# Patient Record
Sex: Male | Born: 1955 | Race: Black or African American | Hispanic: No | Marital: Married | Smoking: Current every day smoker
Health system: Southern US, Community
[De-identification: ages and names within clinical notes are randomized; demographics above are authoritative.]

## PROBLEM LIST (undated history)

## (undated) DIAGNOSIS — I10 Essential (primary) hypertension: Secondary | ICD-10-CM

---

## 2019-12-01 ENCOUNTER — Emergency Department (HOSPITAL_COMMUNITY)
Admission: EM | Admit: 2019-12-01 | Discharge: 2019-12-01 | Disposition: A | Discharge status: Home / Self Care | Payer: Federal, State, Local not specified - PPO | Attending: Emergency Medicine | Admitting: Emergency Medicine

## 2019-12-01 ENCOUNTER — Emergency Department (HOSPITAL_COMMUNITY): Payer: Federal, State, Local not specified - PPO

## 2019-12-01 ENCOUNTER — Encounter (HOSPITAL_COMMUNITY): Payer: Self-pay | Admitting: Emergency Medicine

## 2019-12-01 ENCOUNTER — Other Ambulatory Visit: Payer: Self-pay

## 2019-12-01 DIAGNOSIS — R1031 Right lower quadrant pain: Secondary | ICD-10-CM | POA: Diagnosis not present

## 2019-12-01 DIAGNOSIS — F17200 Nicotine dependence, unspecified, uncomplicated: Secondary | ICD-10-CM | POA: Insufficient documentation

## 2019-12-01 DIAGNOSIS — R110 Nausea: Secondary | ICD-10-CM

## 2019-12-01 DIAGNOSIS — R197 Diarrhea, unspecified: Secondary | ICD-10-CM | POA: Diagnosis not present

## 2019-12-01 DIAGNOSIS — Z79899 Other long term (current) drug therapy: Secondary | ICD-10-CM | POA: Diagnosis not present

## 2019-12-01 DIAGNOSIS — R630 Anorexia: Secondary | ICD-10-CM | POA: Insufficient documentation

## 2019-12-01 DIAGNOSIS — I10 Essential (primary) hypertension: Secondary | ICD-10-CM | POA: Insufficient documentation

## 2019-12-01 DIAGNOSIS — R112 Nausea with vomiting, unspecified: Secondary | ICD-10-CM | POA: Insufficient documentation

## 2019-12-01 HISTORY — DX: Essential (primary) hypertension: I10

## 2019-12-01 LAB — URINALYSIS, ROUTINE W REFLEX MICROSCOPIC
Bilirubin Urine: NEGATIVE
Glucose, UA: NEGATIVE mg/dL
Hgb urine dipstick: NEGATIVE
Ketones, ur: NEGATIVE mg/dL
Leukocytes,Ua: NEGATIVE
Nitrite: NEGATIVE
Protein, ur: NEGATIVE mg/dL
Specific Gravity, Urine: 1.028 (ref 1.005–1.030)
pH: 5 (ref 5.0–8.0)

## 2019-12-01 LAB — COMPREHENSIVE METABOLIC PANEL
ALT: 71 U/L — ABNORMAL HIGH (ref 0–44)
AST: 50 U/L — ABNORMAL HIGH (ref 15–41)
Albumin: 3.7 g/dL (ref 3.5–5.0)
Alkaline Phosphatase: 65 U/L (ref 38–126)
Anion gap: 11 (ref 5–15)
BUN: 31 mg/dL — ABNORMAL HIGH (ref 8–23)
CO2: 25 mmol/L (ref 22–32)
Calcium: 9.8 mg/dL (ref 8.9–10.3)
Chloride: 105 mmol/L (ref 98–111)
Creatinine, Ser: 1.72 mg/dL — ABNORMAL HIGH (ref 0.61–1.24)
GFR calc Af Amer: 48 mL/min — ABNORMAL LOW (ref >60)
GFR calc non Af Amer: 42 mL/min — ABNORMAL LOW (ref >60)
Glucose, Bld: 149 mg/dL — ABNORMAL HIGH (ref 70–99)
Potassium: 4 mmol/L (ref 3.5–5.1)
Sodium: 141 mmol/L (ref 135–145)
Total Bilirubin: 0.8 mg/dL (ref 0.3–1.2)
Total Protein: 7.1 g/dL (ref 6.5–8.1)

## 2019-12-01 LAB — CBC
HCT: 43.5 % (ref 39.0–52.0)
Hemoglobin: 14.4 g/dL (ref 13.0–17.0)
MCH: 30 pg (ref 26.0–34.0)
MCHC: 33.1 g/dL (ref 30.0–36.0)
MCV: 90.6 fL (ref 80.0–100.0)
Platelets: 283 10*3/uL (ref 150–400)
RBC: 4.8 MIL/uL (ref 4.22–5.81)
RDW: 12.3 % (ref 11.5–15.5)
WBC: 8.1 10*3/uL (ref 4.0–10.5)
nRBC: 0 % (ref 0.0–0.2)

## 2019-12-01 LAB — LIPASE, BLOOD: Lipase: 42 U/L (ref 11–51)

## 2019-12-01 MED ORDER — SODIUM CHLORIDE 0.9% FLUSH: 3.0000 mL | Freq: Once | INTRAVENOUS | Status: DC

## 2019-12-01 MED ORDER — IOHEXOL 300 MG/ML  SOLN
100.0000 mL | Freq: Once | INTRAMUSCULAR | Status: AC | PRN
  Administered 2019-12-01: 100 mL via INTRAVENOUS

## 2019-12-01 MED ORDER — LACTATED RINGERS IV BOLUS
1000.0000 mL | Freq: Once | INTRAVENOUS | Status: AC
  Administered 2019-12-01: 1000 mL via INTRAVENOUS

## 2019-12-01 NOTE — ED Notes (Addendum)
Discharge instructions discussed with pt. Pt verbalized understanding with no questions at this time.  

## 2019-12-01 NOTE — ED Provider Notes (Signed)
[facility] EMERGENCY DEPARTMENT Provider Note   CSN: [id number] Arrival date & time: 12/01/19  1953     History Chief Complaint  Patient presents with  . Abdominal Pain    Richard Bates is a 64 y.o. male.  HPI 64 year old male with history of hypertension presenting to the ED for right lower quadrant abdominal pain.  Patient states that for 2 days he has had some anorexia, nausea as well as 2 episodes of nonbloody diarrhea.  Patient states that the pain began diffusely, is located in the right lower quadrant now, states that he is having persistent cramping type pain.  No vomiting, does report some nausea and dry heaving.  No fevers or chills, denies any chest pain or shortness of breath.  No flank pain, no hematuria, no dysuria, no groin pain, no testicular pain.      Past Medical History:  Diagnosis Date  . Hypertension     There are no problems to display for this patient.   History reviewed. No pertinent surgical history.     No family history on file.  Social History   Tobacco Use  . Smoking status: Current Every Day Smoker  . Smokeless tobacco: Never Used  Substance Use Topics  . Alcohol use: Yes  . Drug use: Never    Home Medications Prior to Admission medications   Medication Sig Start Date End Date Taking? Authorizing Provider  atorvastatin (LIPITOR) 40 MG tablet Take 40 mg by mouth daily. 09/22/19  Yes [provider]  hydrochlorothiazide (MICROZIDE) 12.5 MG capsule Take 12.5 mg by mouth daily. 09/23/19  Yes [provider]  metoprolol tartrate (LOPRESSOR) 50 MG tablet Take 50 mg by mouth daily. 09/22/19  Yes [provider]  Multiple Vitamin (MULTIVITAMIN WITH MINERALS) TABS tablet Take 1 tablet by mouth daily.   Yes [provider]  olmesartan (BENICAR) 40 MG tablet Take 40 mg by mouth daily. 09/22/19  Yes [provider]    Allergies    Patient has no known allergies.  Review of  Systems   Review of Systems  Constitutional: Negative for chills and fever.  HENT: Negative for ear pain and sore throat.   Eyes: Negative for pain and visual disturbance.  Respiratory: Negative for cough and shortness of breath.   Cardiovascular: Negative for chest pain and palpitations.  Gastrointestinal: Positive for abdominal pain and diarrhea. Negative for vomiting.  Genitourinary: Negative for dysuria and hematuria.  Musculoskeletal: Negative for arthralgias and back pain.  Skin: Negative for color change and rash.  Neurological: Negative for seizures and syncope.  All other systems reviewed and are negative.   Physical Exam Updated Vital Signs BP (!) 145/94   Pulse 85   Temp 98.3 F (36.8 C) (Oral)   Resp (!) 22   SpO2 96%   Physical Exam Vitals and nursing note reviewed.  Constitutional:      General: He is not in acute distress.    Appearance: He is well-developed. He is not ill-appearing, toxic-appearing or diaphoretic.  HENT:     Head: Normocephalic and atraumatic.  Eyes:     Conjunctiva/sclera: Conjunctivae normal.  Cardiovascular:     Rate and Rhythm: Normal rate and regular rhythm.     Heart sounds: No murmur.  Pulmonary:     Effort: Pulmonary effort is normal. No respiratory distress.     Breath sounds: Normal breath sounds.  Abdominal:     Palpations: Abdomen is soft.     Tenderness: There  is abdominal tenderness in the right lower quadrant. There is no right CVA tenderness, left CVA tenderness, guarding or rebound. Negative signs include Murphy's sign, Rovsing's sign, McBurney's sign, psoas sign and obturator sign.     Hernia: No hernia is present.  Musculoskeletal:     Cervical back: Neck supple.  Skin:    General: Skin is warm and dry.     Capillary Refill: Capillary refill takes less than 2 seconds.  Neurological:     General: No focal deficit present.     Mental Status: He is alert.     ED Results / Procedures / Treatments   Labs (all labs  ordered are listed, but only abnormal results are displayed) Labs Reviewed  COMPREHENSIVE METABOLIC PANEL - Abnormal; Notable for the following components:      Result Value   Glucose, Bld 149 (*)    BUN 31 (*)    Creatinine, Ser 1.72 (*)    AST 50 (*)    ALT 71 (*)    GFR calc non Af Amer 42 (*)    GFR calc Af Amer 48 (*)    All other components within normal limits  LIPASE, BLOOD  CBC  URINALYSIS, ROUTINE W REFLEX MICROSCOPIC    EKG None  Radiology CT ABDOMEN PELVIS W CONTRAST  Result Date: 12/01/2019 CLINICAL DATA:  Generalized abdominal pain, acute, with neutropenia EXAM: CT ABDOMEN AND PELVIS WITH CONTRAST TECHNIQUE: Multidetector CT imaging of the abdomen and pelvis was performed using the standard protocol following bolus administration of intravenous contrast. CONTRAST:  OMNIPAQUE IOHEXOL 300 MG/ML  SOLN COMPARISON:  None. FINDINGS: Lower chest: Lung bases are clear. Normal heart size. No pericardial effusion. Hepatobiliary: No focal liver abnormality is seen. No gallstones, gallbladder wall thickening, or biliary dilatation. Pancreas: Unremarkable. No pancreatic ductal dilatation or surrounding inflammatory changes. Spleen: Normal in size without focal abnormality. Adrenals/Urinary Tract: Normal adrenal glands. Mild bilateral symmetric perinephric stranding, a nonspecific finding. No visible or contour deforming renal lesions. No obstructive urolithiasis or hydronephrosis. Some mild circumferential bladder wall thickening. Indentation of the bladder base by a borderline enlarged prostate with median lobe hypertrophy. Stomach/Bowel: Distal esophagus, stomach and duodenal sweep are unremarkable. No small bowel wall thickening or dilatation. No evidence of obstruction. A normal appendix is visualized. No colonic dilatation or wall thickening. Scattered colonic diverticula without focal pericolonic inflammation to suggest diverticulitis. Vascular/Lymphatic: Minimal atherosclerotic  plaque in the aorta and branch vessels. No aneurysm or ectasia. No concerning abdominopelvic adenopathy. Reproductive: Borderline prostatomegaly with median lobe hypertrophy and indentation of the posterior bladder base, as detailed above. Seminal vesicles are unremarkable. Other: No abdominopelvic free fluid or free gas. No bowel containing hernias. Small fat containing inguinal hernias, left greater than right. Musculoskeletal: Multilevel degenerative changes are present in the imaged portions of the spine. Features most pronounced at L4-5 and L5-S1. No acute osseous abnormality or suspicious osseous lesion. IMPRESSION: 1. Question some mild bladder wall thickening with faint perivesicular haze and nonspecific bilateral perinephric stranding. Should correlate with urinalysis to exclude a urinary tract infection. 2. No other acute abdominopelvic abnormality to provide cause for patient's symptoms. 3. Colonic diverticulosis without focal pericolonic inflammation to suggest diverticulitis. 4. Borderline prostatomegaly with median lobe hypertrophy and indentation of the posterior bladder base. Electronically Signed   By: Kreg Shropshire M.D.   On: 12/01/2019 23:04    Procedures Procedures (including critical care time)  Medications Ordered in ED Medications  sodium chloride flush (NS) 0.9 % injection 3 mL (3  mLs Intravenous Not Given 12/01/19 2211)  lactated ringers bolus 1,000 mL (0 mLs Intravenous Stopped 12/01/19 2330)  iohexol (OMNIPAQUE) 300 MG/ML solution 100 mL (100 mLs Intravenous Contrast Given 12/01/19 2243)    ED Course  I have reviewed the triage vital signs and the nursing notes.  Pertinent labs & imaging results that were available during my care of the patient were reviewed by me and considered in my medical decision making (see chart for details).    MDM Rules/Calculators/A&P                      64yo male presenting to the ED for right sided abdominal pain.  On arrival hemodynamically  stable, afebrile, well-appearing.  Patient was mildly tender in the right lower quadrant but no peritoneal signs present, nonsurgical abdomen on my exam.  Patient otherwise appears well, no other symptoms currently concerning for infectious etiology.  Urine appears noninfected, CBC reassuring, metabolic panel significant for kidney function that is elevated, creatinine elevated as well.  Unsure etiology, likely secondary to volume depletion secondary to his diarrhea.  Patient is on an ARB, could be contributing.  CT scan was obtained which was negative for any acute abnormalities, there was some mild right-sided perinephric stranding noted however patient has no signs of abscess, urine noninfected, doubt pyelonephritis, could be element of urinary retention given patient does have enlarged prostate on CT scan..  Patient's exam continued to be benign, patient was given fluid resuscitation in the ED.  Patient instructed to follow-up with his PCP regarding his kidney function, as well as possible medication cessation.  Return precautions were given, he agreed and understood plan, discharged home in good condition.    The attending physician was present and available for all medical decision making and procedures related to this patient's care.  Final Clinical Impression(s) / ED Diagnoses Final diagnoses:  Right lower quadrant abdominal pain  Nausea    Rx / DC Orders ED Discharge Orders    None       Kizzie Fantasia, MD 12/01/19 4081    Blanchie Dessert, MD 12/02/19 1344

## 2019-12-01 NOTE — ED Triage Notes (Signed)
PT c/o RLQ pain that started this morning. Reports nausea/vomiting/diarrhea on Saturday. Denies urinary symptoms.

## 2019-12-01 NOTE — Discharge Instructions (Signed)
Please follow up with your PCP if continued pain. Tylenol and motrin for pain as needed. Your CT scan and labs are reassuring, but your kidney function is up. Please see your PCP for repeat BMP in the next week. Continue to hydrate yourself as much as possible.

## 2019-12-01 NOTE — ED Notes (Signed)
Patient transported to CT 

## 2020-10-10 IMAGING — CT CT ABD-PELV W/ CM
2 of 5 series · 16 of 46 positions shown, 18 images · IV contrast (APPLIED)
Comparison: None.

CLINICAL DATA: Generalized abdominal pain, acute, with neutropenia

EXAM:
CT ABDOMEN AND PELVIS WITH CONTRAST
TECHNIQUE: Multidetector CT imaging of the abdomen and pelvis was performed
using the standard protocol following bolus administration of
intravenous contrast.
CONTRAST:  100mL OMNIPAQUE IOHEXOL 300 MG/ML  SOLN

[Series 3: abd/ pelvis 5.0 i30f 2 · axial · 0.98mm/px · z∈[+705,+1145]mm · 13 of 100 slices shown, 15 images]
[im 6/100  soft-tissue]
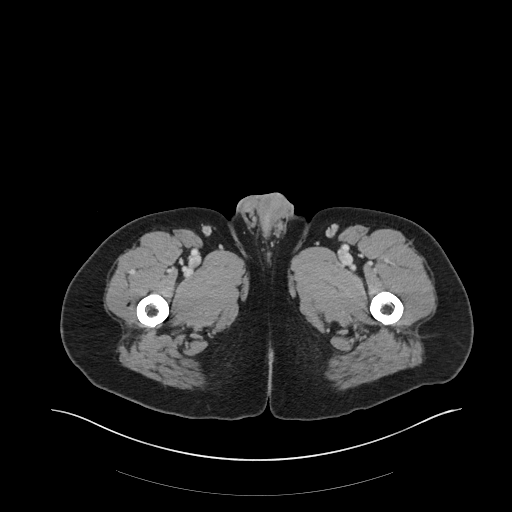
[im 6/100  bone]
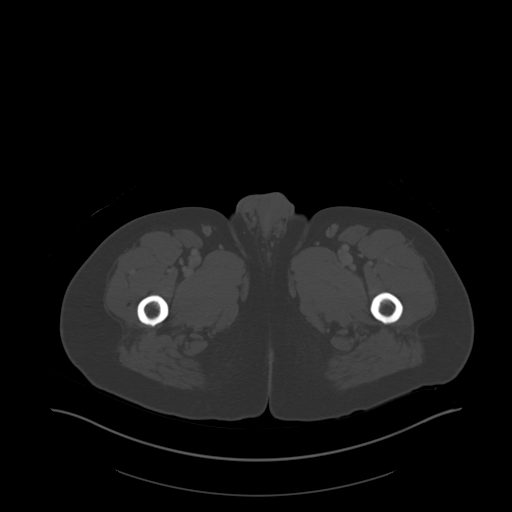
[im 16/100  soft-tissue]
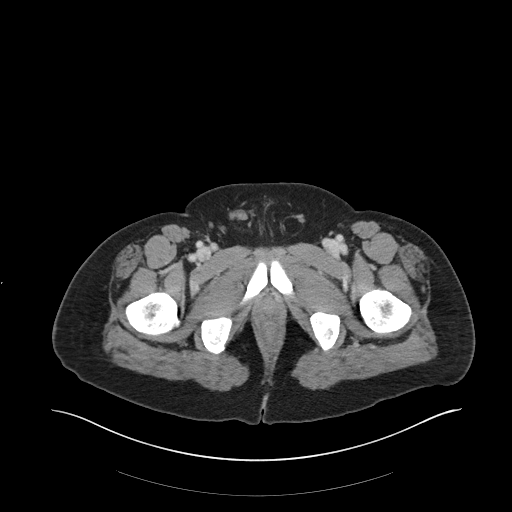
[im 21/100  soft-tissue]
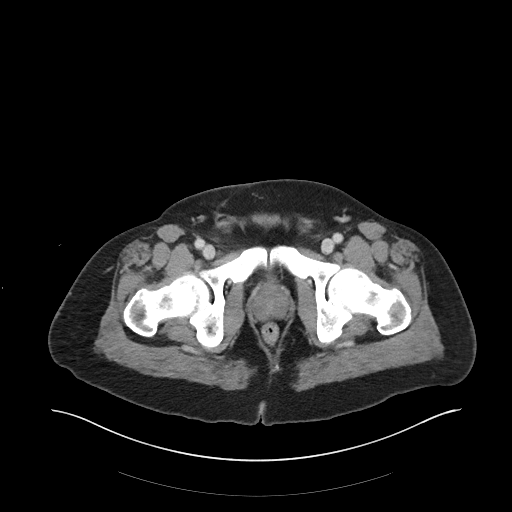
[im 27/100  soft-tissue]
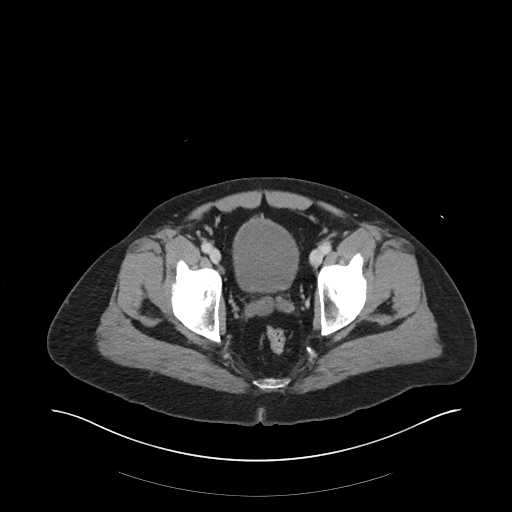
[im 37/100  soft-tissue]
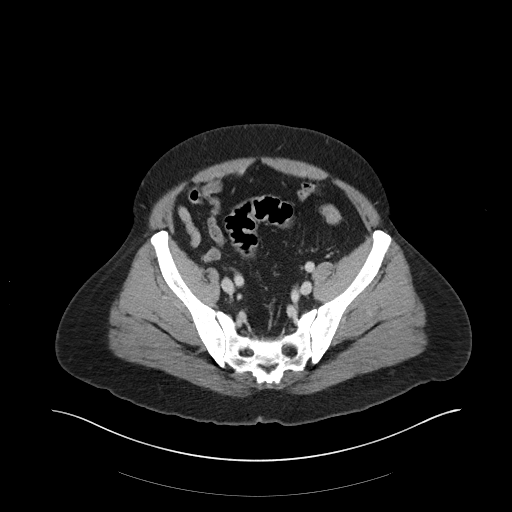
[im 42/100  soft-tissue]
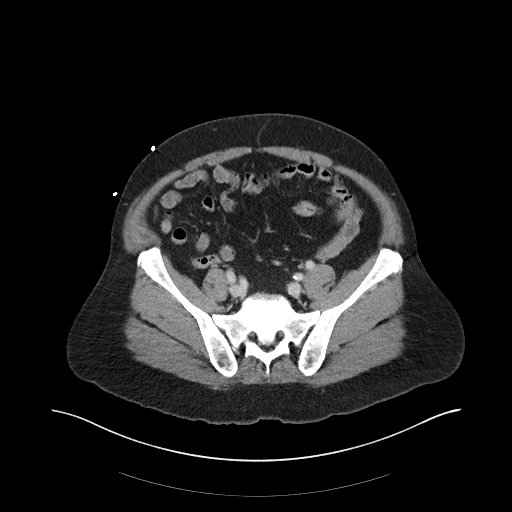
[im 53/100  soft-tissue]
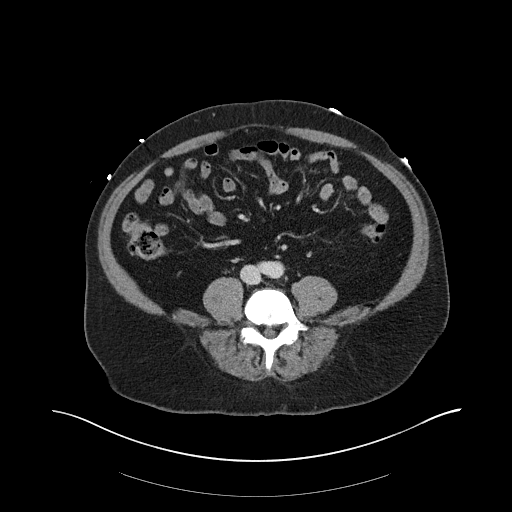
[im 58/100  soft-tissue]
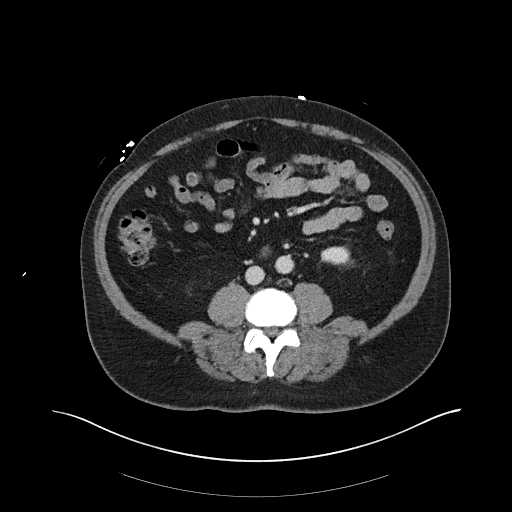
[im 63/100  soft-tissue]
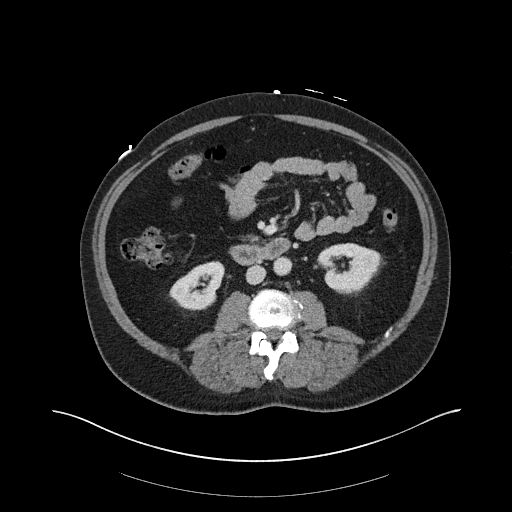
[im 63/100  bone]
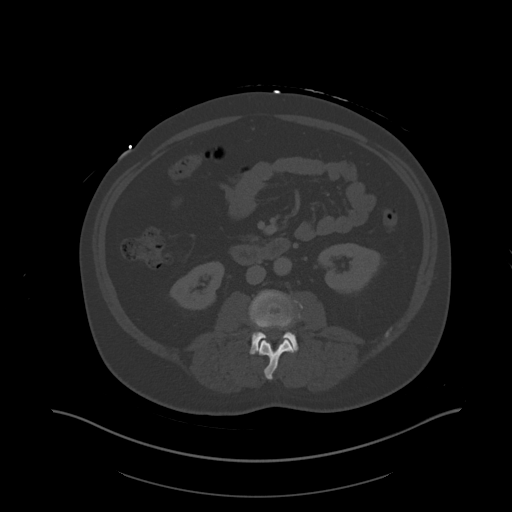
[im 73/100  soft-tissue]
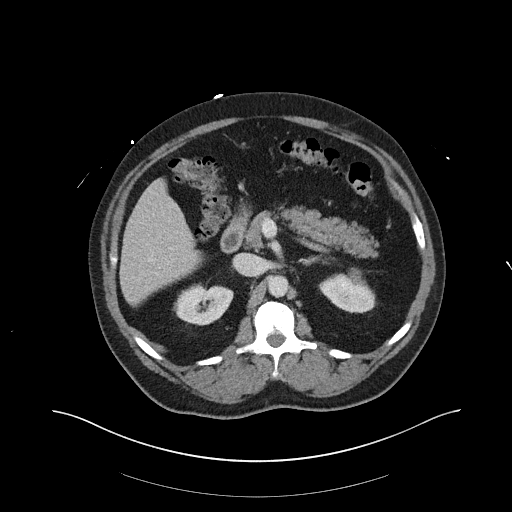
[im 79/100  soft-tissue]
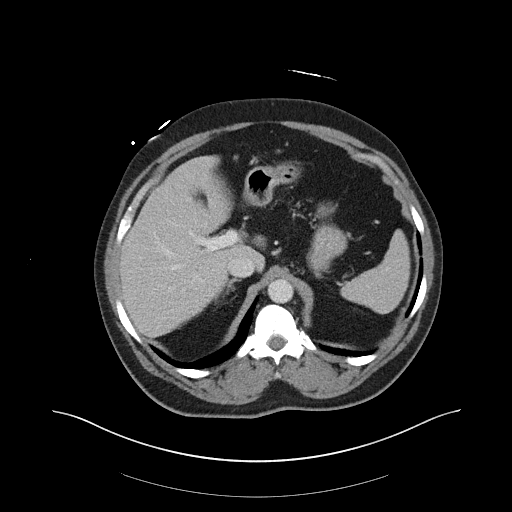
[im 84/100  soft-tissue]
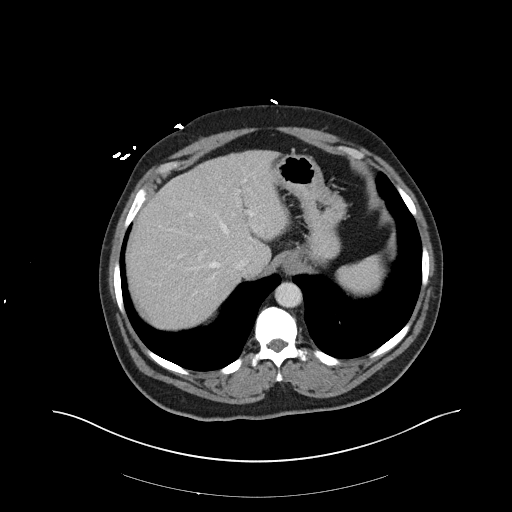
[im 94/100  soft-tissue]
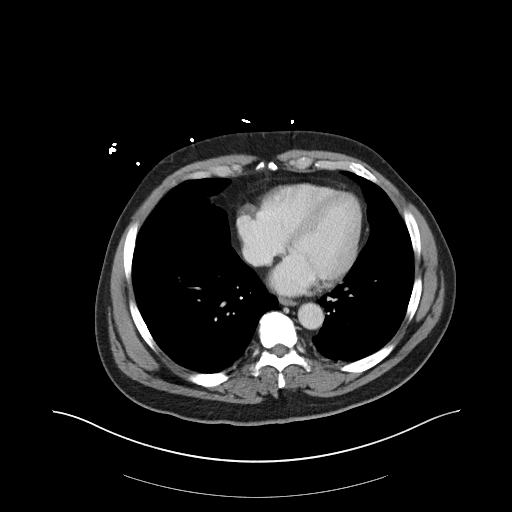

[Series 7: coronal soft tissue · coronal · 0.86mm/px · 3 of 124 slices shown]
[im 42/124  soft-tissue]
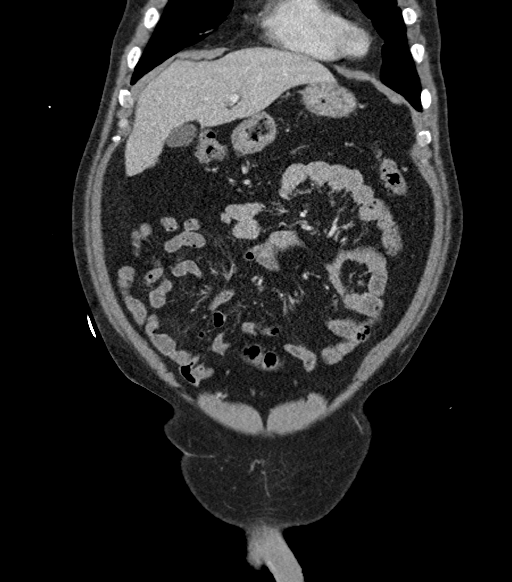
[im 55/124  soft-tissue]
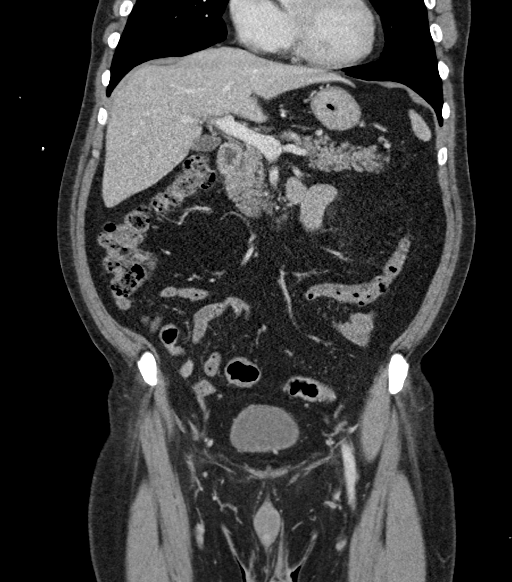
[im 69/124  soft-tissue]
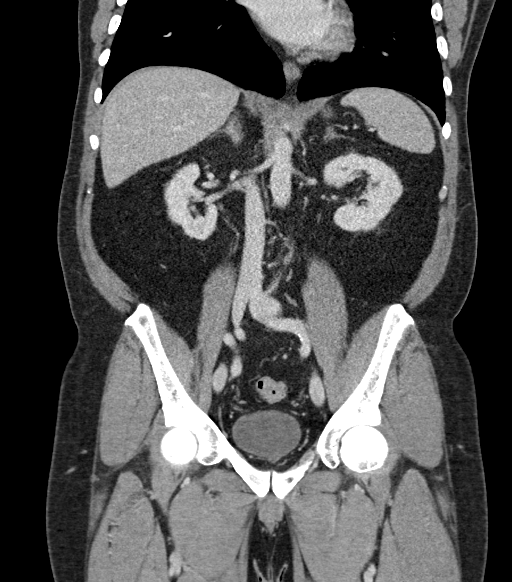

[16 of 46 positions shown; findings below may reference images not displayed]

FINDINGS: Lower chest: Lung bases are clear. Normal heart size. No pericardial
effusion.

Hepatobiliary: No focal liver abnormality is seen. No gallstones,
gallbladder wall thickening, or biliary dilatation.

Pancreas: Unremarkable. No pancreatic ductal dilatation or
surrounding inflammatory changes.

Spleen: Normal in size without focal abnormality.

Adrenals/Urinary Tract: Normal adrenal glands. Mild bilateral
symmetric perinephric stranding, a nonspecific finding. No visible
or contour deforming renal lesions. No obstructive urolithiasis or
hydronephrosis. Some mild circumferential bladder wall thickening.
Indentation of the bladder base by a borderline enlarged prostate
with median lobe hypertrophy.

Stomach/Bowel: Distal esophagus, stomach and duodenal sweep are
unremarkable. No small bowel wall thickening or dilatation. No
evidence of obstruction. A normal appendix is visualized. No colonic
dilatation or wall thickening. Scattered colonic diverticula without
focal pericolonic inflammation to suggest diverticulitis.

Vascular/Lymphatic: Minimal atherosclerotic plaque in the aorta and
branch vessels. No aneurysm or ectasia. No concerning abdominopelvic
adenopathy.

Reproductive: Borderline prostatomegaly with median lobe hypertrophy
and indentation of the posterior bladder base, as detailed above.
Seminal vesicles are unremarkable.

Other: No abdominopelvic free fluid or free gas. No bowel containing
hernias. Small fat containing inguinal hernias, left greater than
right.

Musculoskeletal: Multilevel degenerative changes are present in the
imaged portions of the spine. Features most pronounced at L4-5 and
L5-S1. No acute osseous abnormality or suspicious osseous lesion.
IMPRESSION: 1. Question some mild bladder wall thickening with faint
perivesicular haze and nonspecific bilateral perinephric stranding.
Should correlate with urinalysis to exclude a urinary tract
infection.
2. No other acute abdominopelvic abnormality to provide cause for
patient's symptoms.
3. Colonic diverticulosis without focal pericolonic inflammation to
suggest diverticulitis.
4. Borderline prostatomegaly with median lobe hypertrophy and
indentation of the posterior bladder base.

## 2020-11-11 ENCOUNTER — Ambulatory Visit (INDEPENDENT_AMBULATORY_CARE_PROVIDER_SITE_OTHER): Payer: Federal, State, Local not specified - PPO

## 2020-11-11 ENCOUNTER — Ambulatory Visit (HOSPITAL_COMMUNITY)
Admission: EM | Admit: 2020-11-11 | Discharge: 2020-11-11 | Disposition: A | Discharge status: Home / Self Care | Payer: Federal, State, Local not specified - PPO | Attending: Student | Admitting: Student

## 2020-11-11 ENCOUNTER — Encounter (HOSPITAL_COMMUNITY): Payer: Self-pay

## 2020-11-11 ENCOUNTER — Other Ambulatory Visit: Payer: Self-pay

## 2020-11-11 DIAGNOSIS — Z23 Encounter for immunization: Secondary | ICD-10-CM

## 2020-11-11 DIAGNOSIS — S99921A Unspecified injury of right foot, initial encounter: Secondary | ICD-10-CM | POA: Diagnosis not present

## 2020-11-11 DIAGNOSIS — W2209XA Striking against other stationary object, initial encounter: Secondary | ICD-10-CM | POA: Diagnosis not present

## 2020-11-11 MED ORDER — TETANUS-DIPHTH-ACELL PERTUSSIS 5-2.5-18.5 LF-MCG/0.5 IM SUSY
PREFILLED_SYRINGE | INTRAMUSCULAR | Status: AC
  Filled 2020-11-11: qty 0.5

## 2020-11-11 MED ORDER — DOXYCYCLINE HYCLATE 100 MG PO CAPS: 100.0000 mg | ORAL_CAPSULE | Freq: Two times a day (BID) | ORAL | 0 refills | Status: AC

## 2020-11-11 MED ORDER — NAPROXEN 500 MG PO TABS: 500.0000 mg | ORAL_TABLET | Freq: Two times a day (BID) | ORAL | 0 refills | Status: DC

## 2020-11-11 MED ORDER — TETANUS-DIPHTH-ACELL PERTUSSIS 5-2.5-18.5 LF-MCG/0.5 IM SUSY
0.5000 mL | PREFILLED_SYRINGE | Freq: Once | INTRAMUSCULAR | Status: AC
  Administered 2020-11-11: 0.5 mL via INTRAMUSCULAR

## 2020-11-11 MED ORDER — IBUPROFEN 800 MG PO TABS
800.0000 mg | ORAL_TABLET | Freq: Once | ORAL | Status: AC
  Administered 2020-11-11: 800 mg via ORAL

## 2020-11-11 MED ORDER — TRIPLE ANTIBIOTIC 5-400-5000 EX OINT: TOPICAL_OINTMENT | Freq: Two times a day (BID) | CUTANEOUS | 0 refills | Status: AC

## 2020-11-11 MED ORDER — IBUPROFEN 800 MG PO TABS
ORAL_TABLET | ORAL | Status: AC
  Filled 2020-11-11: qty 1

## 2020-11-11 NOTE — ED Provider Notes (Signed)
[facility]    CSN: [id number] Arrival date & time: 11/11/20  1052      History   Chief Complaint Chief Complaint  Patient presents with  . Foot Injury    HPI Richard Bates is a 65 y.o. male presenting with foot injury. Stepped on nail 2 hours ago- right foot, was wearing boots. Tdap not UTD. Denies other injury; he is feeling well otherwise. Able to walk on the foot but with pain at puncture site.  HPI  Past Medical History:  Diagnosis Date  . Hypertension     There are no problems to display for this patient.   History reviewed. No pertinent surgical history.     Home Medications    Prior to Admission medications   Medication Sig Start Date End Date Taking? Authorizing Provider  doxycycline (VIBRAMYCIN) 100 MG capsule Take 1 capsule (100 mg total) by mouth 2 (two) times daily for 7 days. 11/11/20 11/18/20 Yes Rhys Martini, PA-C  naproxen (NAPROSYN) 500 MG tablet Take 1 tablet (500 mg total) by mouth 2 (two) times daily. 11/11/20  Yes Rhys Martini, PA-C  neomycin-bacitracin-polymyxin (NEOSPORIN) [phone] ointment Apply topically in the morning and at bedtime. 11/11/20  Yes Rhys Martini, PA-C  atorvastatin (LIPITOR) 40 MG tablet Take 40 mg by mouth daily. 09/22/19   [provider]  hydrochlorothiazide (MICROZIDE) 12.5 MG capsule Take 12.5 mg by mouth daily. 09/23/19   [provider]  metoprolol tartrate (LOPRESSOR) 50 MG tablet Take 50 mg by mouth daily. 09/22/19   [provider]  Multiple Vitamin (MULTIVITAMIN WITH MINERALS) TABS tablet Take 1 tablet by mouth daily.    [provider]  olmesartan (BENICAR) 40 MG tablet Take 40 mg by mouth daily. 09/22/19   [provider]    Family History Family History  Problem Relation Age of Onset  . Hypertension Father     Social History Social History   Tobacco Use  . Smoking status: Current Every Day Smoker    Types: Cigarettes  . Smokeless tobacco:  Never Used  Substance Use Topics  . Alcohol use: Yes  . Drug use: Never     Allergies   Patient has no known allergies.   Review of Systems Review of Systems  Skin: Positive for wound.       Right foot- stepped on nail  All other systems reviewed and are negative.    Physical Exam Triage Vital Signs ED Triage Vitals  Enc Vitals Group     BP 11/11/20 1243 (!) 139/91     Pulse Rate 11/11/20 1243 75     Resp 11/11/20 1243 18     Temp 11/11/20 1243 97.7 F (36.5 C)     Temp Source 11/11/20 1243 Oral     SpO2 11/11/20 1243 98 %     Weight --      Height --      Head Circumference --      Peak Flow --      Pain Score 11/11/20 1240 4     Pain Loc --      Pain Edu? --      Excl. in GC? --    No data found.  Updated Vital Signs BP (!) 139/91 (BP Location: Right Arm)   Pulse 75   Temp 97.7 F (36.5 C) (Oral)   Resp 18   SpO2 98%   Visual Acuity Right Eye Distance:   Left Eye Distance:   Bilateral  Distance:    Right Eye Near:   Left Eye Near:    Bilateral Near:     Physical Exam Vitals reviewed.  Constitutional:      Appearance: Normal appearance.  Cardiovascular:     Rate and Rhythm: Normal rate and regular rhythm.     Heart sounds: Normal heart sounds.  Pulmonary:     Effort: Pulmonary effort is normal.     Breath sounds: Normal breath sounds.  Skin:    Comments: R foot- one 27mm circular puncture wound on ventral side. Tender to palpation over wound, but otherwise no tenderness to palpation. No bony deformity. ROM of toes/ankle intact without pain. No wound dorsally.  Neurological:     General: No focal deficit present.     Mental Status: He is alert and oriented to person, place, and time.  Psychiatric:        Mood and Affect: Mood normal.        Behavior: Behavior normal.        Thought Content: Thought content normal.        Judgment: Judgment normal.      UC Treatments / Results  Labs (all labs ordered are listed, but only abnormal  results are displayed) Labs Reviewed - No data to display  EKG   Radiology DG Foot Complete Right  Result Date: 11/11/2020 CLINICAL DATA:  Stepped on a nail. EXAM: RIGHT FOOT COMPLETE - 3+ VIEW COMPARISON:  None. FINDINGS: No acute fracture, dislocation, radiopaque foreign body is identified. There is hallux valgus. Degenerative changes are noted at the first MTP joint including joint space narrowing, marginal osteophytosis, and prominent first metatarsal head hypertrophy. IMPRESSION: No acute osseous abnormality or radiopaque foreign body identified. Electronically Signed   By: Logan Bores M.D.   On: 11/11/2020 13:25    Procedures Procedures (including critical care time)  Medications Ordered in UC Medications  Tdap (BOOSTRIX) injection 0.5 mL (has no administration in time range)  ibuprofen (ADVIL) tablet 800 mg (has no administration in time range)    Initial Impression / Assessment and Plan / UC Course  I have reviewed the triage vital signs and the nursing notes.  Pertinent labs & imaging results that were available during my care of the patient were reviewed by me and considered in my medical decision making (see chart for details).     Doxycycline as below for prophylaxis.  Tdap administered today.  Xray right foot No acute osseous abnormality or radiopaque foreign body identified  Wound cleaned. Continue applying antibacterial ointment and bandaid. Wash with gentle soap and water. Return precautions as below.  Final Clinical Impressions(s) / UC Diagnoses   Final diagnoses:  Injury of right foot, initial encounter     Discharge Instructions     -Take the doxycycline twice daily for 7 days.  -Wash your wound with gentle soap and water 1-2x daily. Gently pat dry. Apply antibiotic ointment, and place bandaid on top.  -Naproxen for pain, up to twice daily. -Seek additional medical attention if wound becomes more painful, red, swollen; if discharge comes out; if you  develop fevers/chills.     ED Prescriptions    Medication Sig Dispense Auth. Provider   doxycycline (VIBRAMYCIN) 100 MG capsule Take 1 capsule (100 mg total) by mouth 2 (two) times daily for 7 days. 14 capsule Hazel Sams, PA-C   neomycin-bacitracin-polymyxin (NEOSPORIN) [phone] ointment Apply topically in the morning and at bedtime. 28.3 g Marin Roberts E, PA-C   naproxen (NAPROSYN) 500  MG tablet Take 1 tablet (500 mg total) by mouth 2 (two) times daily. 30 tablet Rhys Martini, PA-C     PDMP not reviewed this encounter.   Rhys Martini, PA-C 11/11/20 1401

## 2020-11-11 NOTE — ED Triage Notes (Signed)
Pt is here with a foot injury after stepping on an nail 2 hours ago, the nail went through his shoe. Pt has not taken any meds to relieve discomfort.

## 2020-11-11 NOTE — Discharge Instructions (Addendum)
-  Take the doxycycline twice daily for 7 days.  -Wash your wound with gentle soap and water 1-2x daily. Gently pat dry. Apply antibiotic ointment, and place bandaid on top.  -Naproxen for pain, up to twice daily. -Seek additional medical attention if wound becomes more painful, red, swollen; if discharge comes out; if you develop fevers/chills.

## 2021-09-17 ENCOUNTER — Emergency Department (HOSPITAL_COMMUNITY)
Admission: EM | Admit: 2021-09-17 | Discharge: 2021-09-18 | Disposition: A | Discharge status: Home / Self Care | Payer: Federal, State, Local not specified - PPO | Attending: Emergency Medicine | Admitting: Emergency Medicine

## 2021-09-17 ENCOUNTER — Other Ambulatory Visit: Payer: Self-pay

## 2021-09-17 ENCOUNTER — Encounter (HOSPITAL_COMMUNITY): Payer: Self-pay | Admitting: *Deleted

## 2021-09-17 DIAGNOSIS — Z79899 Other long term (current) drug therapy: Secondary | ICD-10-CM | POA: Diagnosis not present

## 2021-09-17 DIAGNOSIS — I10 Essential (primary) hypertension: Secondary | ICD-10-CM | POA: Insufficient documentation

## 2021-09-17 DIAGNOSIS — F17210 Nicotine dependence, cigarettes, uncomplicated: Secondary | ICD-10-CM | POA: Insufficient documentation

## 2021-09-17 LAB — CBC WITH DIFFERENTIAL/PLATELET
Abs Immature Granulocytes: 0.01 10*3/uL (ref 0.00–0.07)
Basophils Absolute: 0 10*3/uL (ref 0.0–0.1)
Basophils Relative: 0 %
Eosinophils Absolute: 0.1 10*3/uL (ref 0.0–0.5)
Eosinophils Relative: 2 %
HCT: 42.9 % (ref 39.0–52.0)
Hemoglobin: 14.7 g/dL (ref 13.0–17.0)
Immature Granulocytes: 0 %
Lymphocytes Relative: 40 %
Lymphs Abs: 2.3 10*3/uL (ref 0.7–4.0)
MCH: 30.8 pg (ref 26.0–34.0)
MCHC: 34.3 g/dL (ref 30.0–36.0)
MCV: 89.9 fL (ref 80.0–100.0)
Monocytes Absolute: 0.8 10*3/uL (ref 0.1–1.0)
Monocytes Relative: 13 %
Neutro Abs: 2.5 10*3/uL (ref 1.7–7.7)
Neutrophils Relative %: 45 %
Platelets: 234 10*3/uL (ref 150–400)
RBC: 4.77 MIL/uL (ref 4.22–5.81)
RDW: 13.7 % (ref 11.5–15.5)
WBC: 5.6 10*3/uL (ref 4.0–10.5)
nRBC: 0 % (ref 0.0–0.2)

## 2021-09-17 LAB — BASIC METABOLIC PANEL
Anion gap: 9 (ref 5–15)
BUN: 22 mg/dL (ref 8–23)
CO2: 24 mmol/L (ref 22–32)
Calcium: 9.5 mg/dL (ref 8.9–10.3)
Chloride: 105 mmol/L (ref 98–111)
Creatinine, Ser: 1.26 mg/dL — ABNORMAL HIGH (ref 0.61–1.24)
GFR, Estimated: >60 mL/min (ref >60)
Glucose, Bld: 106 mg/dL — ABNORMAL HIGH (ref 70–99)
Potassium: 4.4 mmol/L (ref 3.5–5.1)
Sodium: 138 mmol/L (ref 135–145)

## 2021-09-17 LAB — TROPONIN I (HIGH SENSITIVITY)
Troponin I (High Sensitivity): 6 ng/L (ref <18)
Troponin I (High Sensitivity): 6 ng/L (ref <18)

## 2021-09-17 MED ORDER — LORAZEPAM 1 MG PO TABS
0.0000 mg | ORAL_TABLET | Freq: Four times a day (QID) | ORAL | Status: DC
  Administered 2021-09-17: 1 mg via ORAL
  Filled 2021-09-17: qty 1

## 2021-09-17 MED ORDER — THIAMINE HCL 100 MG PO TABS
100.0000 mg | ORAL_TABLET | Freq: Every day | ORAL | Status: DC
  Administered 2021-09-17: 100 mg via ORAL
  Filled 2021-09-17: qty 1

## 2021-09-17 MED ORDER — LORAZEPAM 2 MG/ML IJ SOLN: 0.0000 mg | Freq: Four times a day (QID) | INTRAMUSCULAR | Status: DC

## 2021-09-17 MED ORDER — LORAZEPAM 2 MG/ML IJ SOLN: 0.0000 mg | Freq: Two times a day (BID) | INTRAMUSCULAR | Status: DC

## 2021-09-17 MED ORDER — METOPROLOL TARTRATE 25 MG PO TABS
25.0000 mg | ORAL_TABLET | Freq: Once | ORAL | Status: AC
  Administered 2021-09-17: 25 mg via ORAL
  Filled 2021-09-17: qty 1

## 2021-09-17 MED ORDER — THIAMINE HCL 100 MG/ML IJ SOLN: 100.0000 mg | Freq: Every day | INTRAMUSCULAR | Status: DC

## 2021-09-17 MED ORDER — LORAZEPAM 1 MG PO TABS: 0.0000 mg | ORAL_TABLET | Freq: Two times a day (BID) | ORAL | Status: DC

## 2021-09-17 NOTE — ED Provider Notes (Signed)
Emergency Medicine Provider Triage Evaluation Note  Richard Bates , a 65 y.o. male  was evaluated in triage.  Pt complains of elevated blood pressure readings for the past 1 to 2 weeks.  Patient states that typically his blood pressure runs in the 130s over 80s.  He states that lately it has been running as high as 160.  Patient states that his wife convinced him to come to the ED tonight for further evaluation.  He denies any symptoms at this time.  He states he has not had any headaches, chest pain, shortness of breath, vision changes, decreased urine output.  Wife states that he is acting at baseline.  He does mention that he typically drinks 4 glasses of liquor per day and he has not done it today.  He also mentions that he missed his 3 PM dose of metoprolol.  He states that he takes 25 mg in the morning and 25 around 3 PM.   Review of Systems  Positive: + HTN Negative: - chest pain, SOB, headache, vision changes  Physical Exam  Ht 5\' 8"  (1.727 m)   Wt 90.7 kg   BMI 30.41 kg/m  Gen:   Awake, no distress   Resp:  Normal effort  MSK:   Moves extremities without difficulty  Other:  A&O x 4. Following all commands. PERRL. Acting at baseline per wife.   Medical Decision Making  Medically screening exam initiated at 8:32 PM.  Appropriate orders placed.  ISAURO SKELLEY was informed that the remainder of the evaluation will be completed by another provider, this initial triage assessment does not replace that evaluation, and the importance of remaining in the ED until their evaluation is complete.  BP significantly elevated in triage at 192/129. Similar in other arm.  Will provide his missed dose of blood pressure medication and obtain labs, EKG.  Patient current every day drinker and has not had his typical drinks today.  We will plan for CIWA score to see if Ativan can be provided for patient as this may be causing falsely elevated blood pressures.  He is neurologically intact and denies any  headache.    Thornell Sartorius, PA-C 09/17/21 2038    2039, MD 09/18/21 0157

## 2021-09-17 NOTE — ED Notes (Signed)
Here for elevated bp for at least a week. Hx of elevated bp and takes meds for same. Med has recently been changed. Denies any other symptoms

## 2021-09-17 NOTE — ED Triage Notes (Addendum)
Pt states for the last 2 weeks his sbp has been in the 160's.  Denies any headaches, sob or chest pain.  BP 192/129 in triage.  Pt has not had his typical 4 glasses or liquor than he normally has at this time.

## 2021-09-18 NOTE — Discharge Instructions (Signed)
Please continue to take your blood pressure medicines as previously prescribed, follow-up with your primary care as previously scheduled for this week, and return to the ER with any chest pain, difficulty breathing, nausea or vomiting does not stop, decrease in urine output, or any other new severe symptom.

## 2021-09-18 NOTE — ED Provider Notes (Signed)
[facility] EMERGENCY DEPARTMENT Provider Note   CSN: [id number] Arrival date & time: 09/17/21  2022     History Chief Complaint  Patient presents with   Hypertension    Richard Bates is a 65 y.o. male with history of hypertension who presents with high blood pressure readings at home for the last 2 weeks with SBP's in the 160s.  Denies any symptoms from this change in pressure.  States that he only noticed it because he checks his pressure every day at home and is typically in the systolics in the 130s to 150s.  Came to the ER at the prompting of his wife.  Of note patient usually has 4 glasses of liquor in the evening but he has not had those today as he has been in the emergency department.  Placed on CIWA protocol from triage.  I personally reviewed this patient's medical records.  He has history of hypertension for which he takes olmesartan and metoprolol.  Endorses compliance with his medications without any recent changes in his medications or dosages.  No recent illnesses or hospitalizations.  HPI     Past Medical History:  Diagnosis Date   Hypertension     There are no problems to display for this patient.   History reviewed. No pertinent surgical history.     Family History  Problem Relation Age of Onset   Hypertension Father     Social History   Tobacco Use   Smoking status: Every Day    Packs/day: 0.25    Types: Cigarettes   Smokeless tobacco: Never  Substance Use Topics   Alcohol use: Yes    Comment: 3 or 4 glasses of liquor per day   Drug use: Never    Home Medications Prior to Admission medications   Medication Sig Start Date End Date Taking? Authorizing Provider  acetaminophen (TYLENOL) 500 MG tablet Take 500 mg by mouth every 6 (six) hours as needed for moderate pain or headache.   Yes [provider]  atorvastatin (LIPITOR) 40 MG tablet Take 40 mg by mouth daily. 09/22/19  Yes [provider]  metoprolol  tartrate (LOPRESSOR) 50 MG tablet Take 25 mg by mouth See admin instructions. 8 am and 3 pm 09/22/19  Yes [provider]  Multiple Vitamin (MULTIVITAMIN WITH MINERALS) TABS tablet Take 1 tablet by mouth daily.   Yes [provider]  olmesartan (BENICAR) 40 MG tablet Take 40 mg by mouth daily. 09/22/19  Yes [provider]  Omega 3 1000 MG CAPS Take 1,000 mg by mouth daily.   Yes [provider]  naproxen (NAPROSYN) 500 MG tablet Take 1 tablet (500 mg total) by mouth 2 (two) times daily. Patient not taking: No sig reported 11/11/20   Rhys Martini, PA-C  neomycin-bacitracin-polymyxin (NEOSPORIN) [phone] ointment Apply topically in the morning and at bedtime. Patient not taking: No sig reported 11/11/20   Rhys Martini, PA-C    Allergies    Patient has no known allergies.  Review of Systems   Review of Systems  Constitutional: Negative.   HENT: Negative.    Eyes: Negative.   Respiratory: Negative.    Cardiovascular: Negative.   Gastrointestinal: Negative.   Genitourinary: Negative.   Musculoskeletal: Negative.   Neurological: Negative.    Physical Exam Updated Vital Signs BP (!) 166/106 (BP Location: Right Arm)   Pulse 69   Temp 98 F (36.7 C) (Oral)   Resp 16   Ht 5\' 8"  (  1.727 m)   Wt 90.7 kg   SpO2 100%   BMI 30.41 kg/m   Physical Exam Vitals and nursing note reviewed.  Constitutional:      Appearance: He is obese. He is not ill-appearing or toxic-appearing.  HENT:     Head: Normocephalic and atraumatic.     Nose: Nose normal. No congestion.     Mouth/Throat:     Mouth: Mucous membranes are moist.     Pharynx: No oropharyngeal exudate or posterior oropharyngeal erythema.  Eyes:     General:        Right eye: No discharge.        Left eye: No discharge.     Extraocular Movements: Extraocular movements intact.     Conjunctiva/sclera: Conjunctivae normal.     Pupils: Pupils are equal, round, and reactive to light.   Cardiovascular:     Rate and Rhythm: Normal rate and regular rhythm.     Pulses: Normal pulses.     Heart sounds: Normal heart sounds. No murmur heard. Pulmonary:     Effort: Pulmonary effort is normal. No tachypnea or respiratory distress.     Breath sounds: Normal breath sounds. No wheezing or rales.  Abdominal:     General: Bowel sounds are normal. There is no distension.     Palpations: Abdomen is soft.     Tenderness: There is no abdominal tenderness.  Musculoskeletal:        General: No deformity.     Cervical back: Neck supple.     Right lower leg: No edema.     Left lower leg: No edema.  Lymphadenopathy:     Cervical: No cervical adenopathy.  Skin:    General: Skin is warm and dry.     Capillary Refill: Capillary refill takes less than 2 seconds.  Neurological:     General: No focal deficit present.     Mental Status: He is alert. Mental status is at baseline.     GCS: GCS eye subscore is 4. GCS verbal subscore is 5. GCS motor subscore is 6.     Cranial Nerves: Cranial nerves 2-12 are intact.     Sensory: Sensation is intact.     Motor: Motor function is intact.     Gait: Gait is intact.  Psychiatric:        Mood and Affect: Mood normal.    ED Results / Procedures / Treatments   Labs (all labs ordered are listed, but only abnormal results are displayed) Labs Reviewed  BASIC METABOLIC PANEL - Abnormal; Notable for the following components:      Result Value   Glucose, Bld 106 (*)    Creatinine, Ser 1.26 (*)    All other components within normal limits  CBC WITH DIFFERENTIAL/PLATELET  TROPONIN I (HIGH SENSITIVITY)  TROPONIN I (HIGH SENSITIVITY)    EKG EKG Interpretation  Date/Time:  Saturday September 17 2021 20:34:27 EST Ventricular Rate:  76 PR Interval:  166 QRS Duration: 96 QT Interval:  366 QTC Calculation: 411 R Axis:   67 Text Interpretation: Normal sinus rhythm Nonspecific T wave abnormality lateral leads, no STEMI Confirmed by Alvester Chou  [id number]) on 09/17/2021 9:58:08 PM  Radiology No results found.  Procedures Procedures   Medications Ordered in ED Medications  metoprolol tartrate (LOPRESSOR) tablet 25 mg (25 mg Oral Given 09/17/21 2053)    ED Course  I have reviewed the triage vital signs and the nursing notes.  Pertinent labs & imaging results that were  available during my care of the patient were reviewed by me and considered in my medical decision making (see chart for details).  Clinical Course as of 09/18/21 1434  Sat Sep 17, 2021  2050 CIWA of 5; ativan provided  [MV]    Clinical Course User Index [MV] Tanda Rockers, [place]   MDM Rules/Calculators/A&P                         65 year old male on antihypertensive medications who presents with concern for asymptomatic hypertension at home.  Significantly hypertensive in triage to 192/129; subsequent readings improved though he remained hypertensive to the 170s / 90s.  Vital signs were otherwise normal.  Cardiopulmonary exam is normal, abdominal exam is benign.  Neurologic exam without focal deficit and patient is neurovascularly intact in all 4 extremities.  Patient was given his evening dose of metoprolol while in the emergency department as well as 1 tablet of Ativan and thiamine per CIWA protocol.  Improvement in his blood pressures to the 160s over 100s.  Patient remains asymptomatic at this time.  Basic labs were ordered from triage.  CBC is unremarkable, BMP with creatinine of 1.2 improved from patient's baseline.  Troponin negative, 6.  EKG with normal sinus rhythm without STEMI.  Given reassuring work-up in the emergency department as well as lack of symptomatology from his hypertension, no further work-up is warranted in ER at this time.  Do recommend patient follow-up in the outpatient setting closely with his primary care doctor for management of his blood pressures.  Instructions for blood pressure journal were given.    Nachman and his wife  voiced understanding with medical evaluation and treatment plan thus far.  Each of their questions answered to their expressed satisfaction.  Return precautions are given.  Patient is well-appearing, stable, and appropriate for discharge at this time.   This chart was dictated using voice recognition software, Dragon. Despite the best efforts of this provider to proofread and correct errors, errors may still occur which can change documentation meaning.  Final Clinical Impression(s) / ED Diagnoses Final diagnoses:  Hypertension, unspecified type    Rx / DC Orders ED Discharge Orders     None        Sherrilee Gilles 09/18/21 1435    Terald Sleeper, MD 09/18/21 [phone]

## 2021-09-21 IMAGING — DX DG FOOT COMPLETE 3+V*R*
3 series · 3 of 3 positions shown · non-contrast
Comparison: None.

CLINICAL DATA: Stepped on a nail.

EXAM:
RIGHT FOOT COMPLETE - 3+ VIEW

[foot ap]
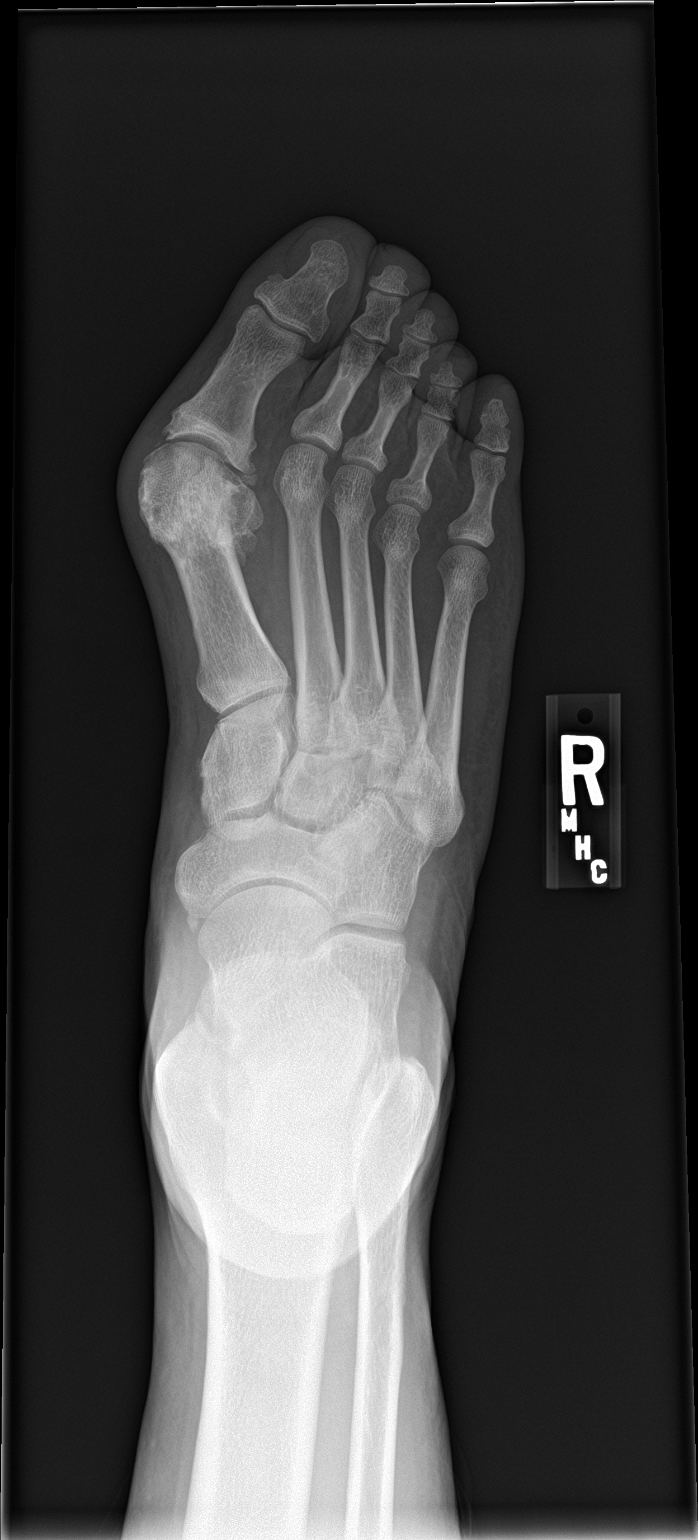

[foot obl]
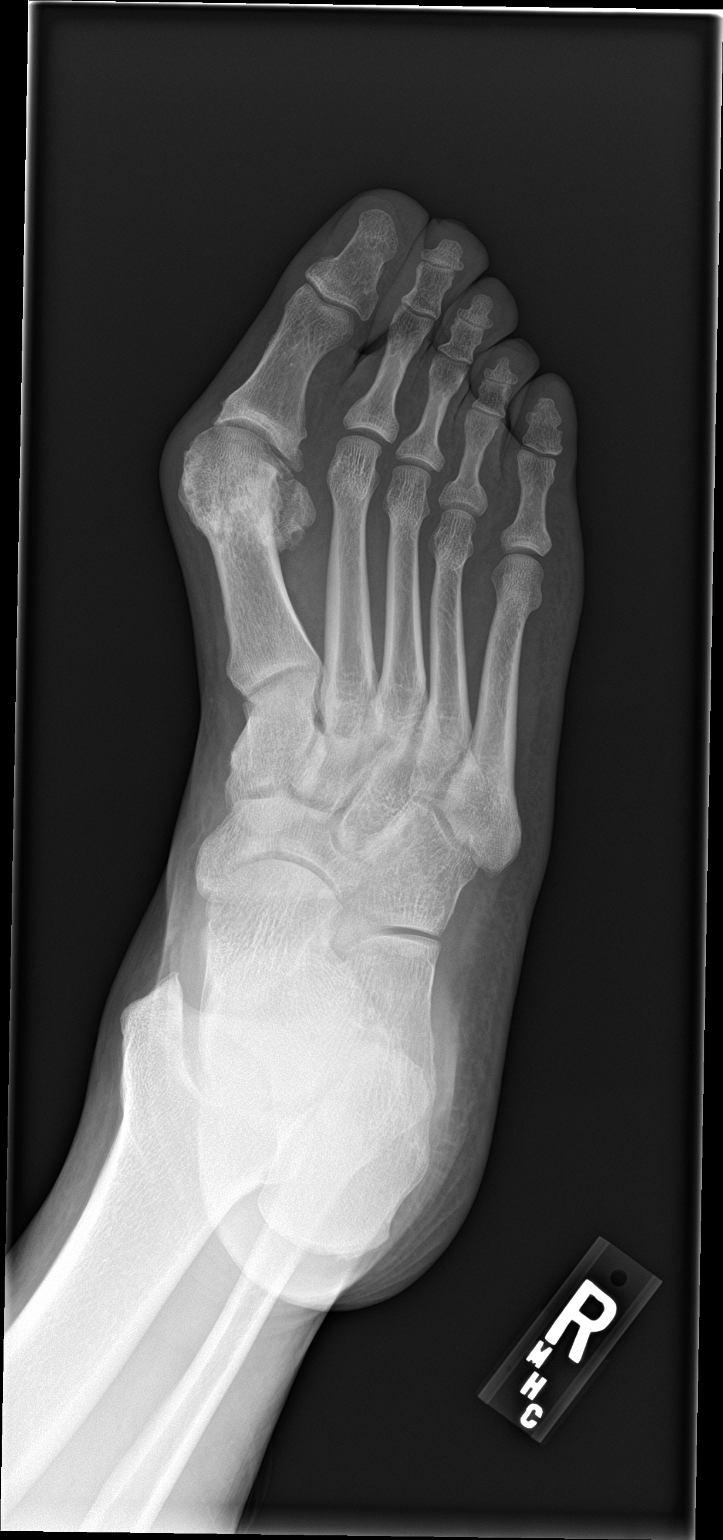

[foot lat]
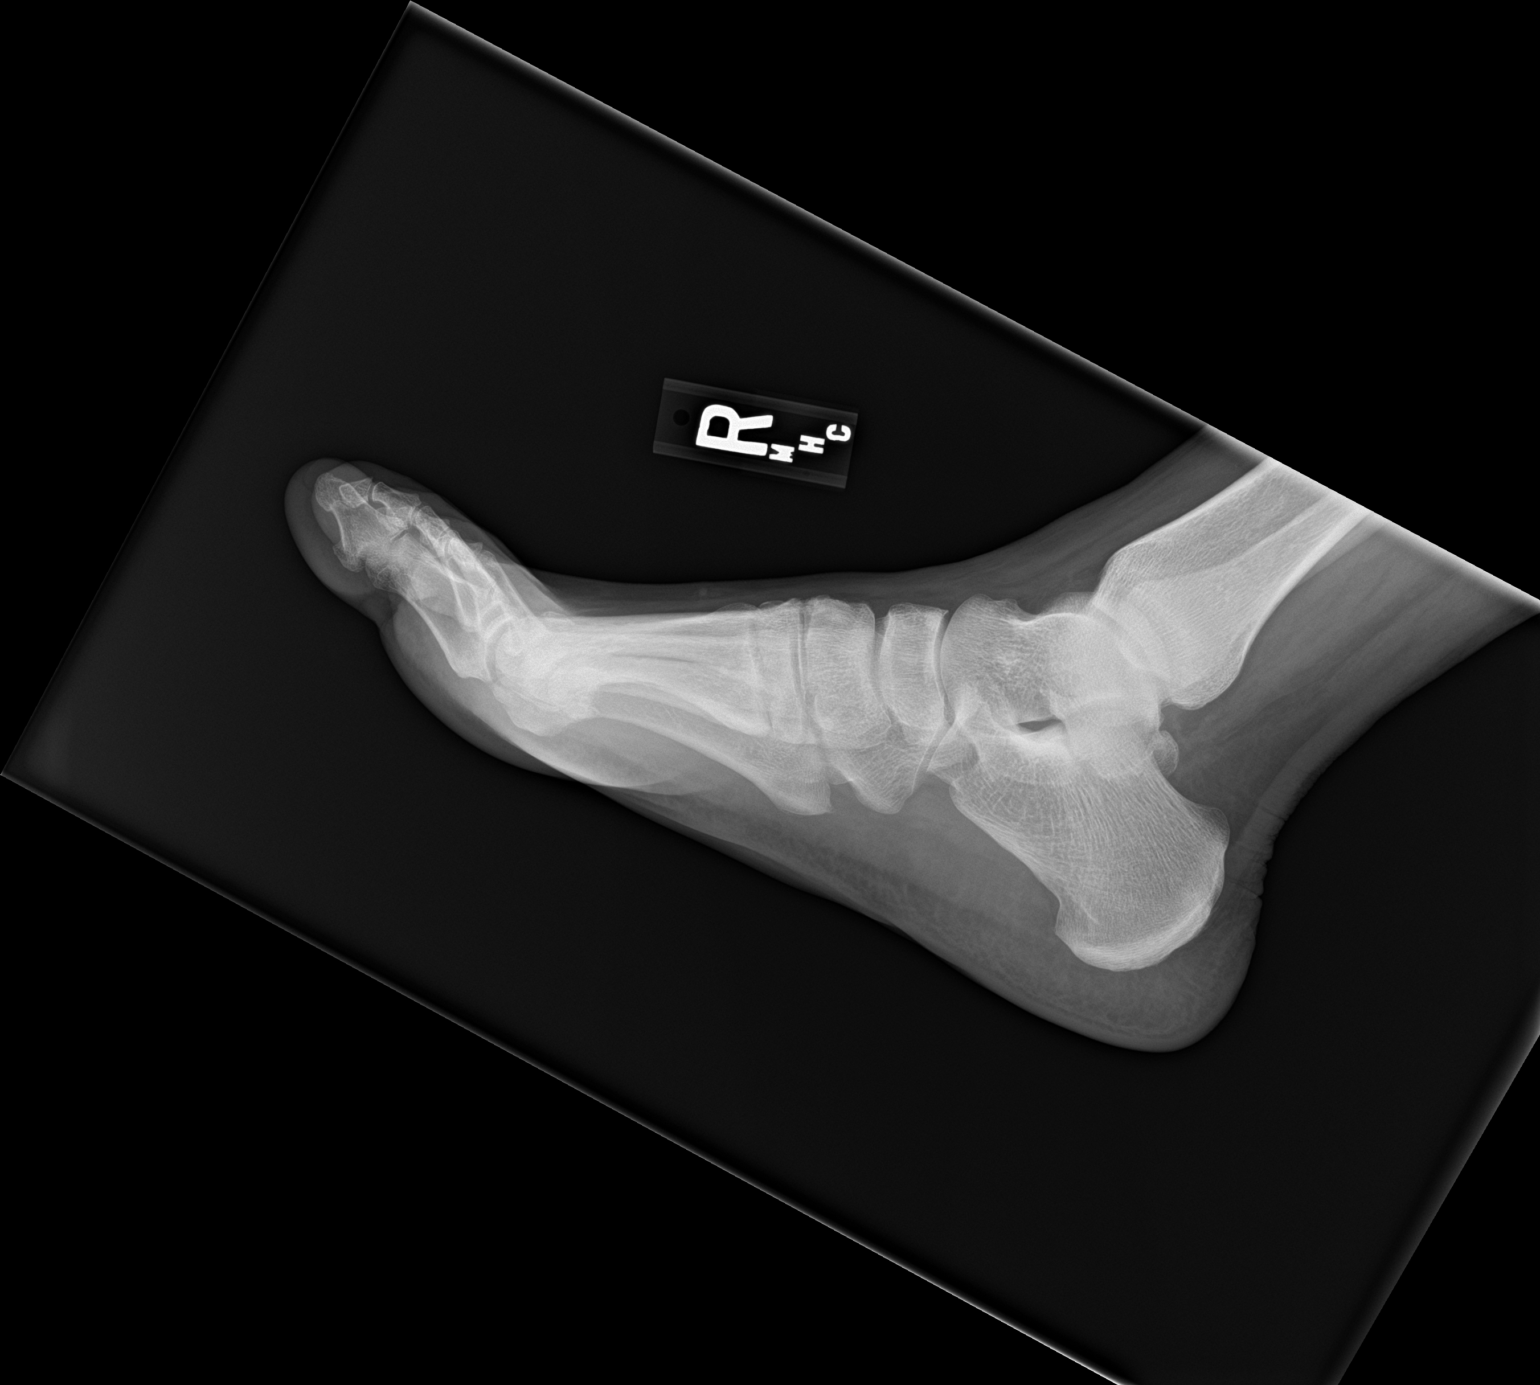

[3 of 3 positions shown; findings below may reference images not displayed]

FINDINGS: No acute fracture, dislocation, radiopaque foreign body is
identified. There is hallux valgus. Degenerative changes are noted
at the first MTP joint including joint space narrowing, marginal
osteophytosis, and prominent first metatarsal head hypertrophy.
IMPRESSION: No acute osseous abnormality or radiopaque foreign body identified.

## 2023-05-21 ENCOUNTER — Emergency Department (HOSPITAL_BASED_OUTPATIENT_CLINIC_OR_DEPARTMENT_OTHER): Payer: Federal, State, Local not specified - PPO | Admitting: Radiology

## 2023-05-21 ENCOUNTER — Encounter (HOSPITAL_BASED_OUTPATIENT_CLINIC_OR_DEPARTMENT_OTHER): Payer: Self-pay | Admitting: Emergency Medicine

## 2023-05-21 ENCOUNTER — Other Ambulatory Visit (HOSPITAL_BASED_OUTPATIENT_CLINIC_OR_DEPARTMENT_OTHER): Payer: Self-pay

## 2023-05-21 ENCOUNTER — Emergency Department (HOSPITAL_BASED_OUTPATIENT_CLINIC_OR_DEPARTMENT_OTHER)
Admission: EM | Admit: 2023-05-21 | Discharge: 2023-05-21 | Disposition: A | Discharge status: Home / Self Care | Payer: Federal, State, Local not specified - PPO | Attending: Emergency Medicine | Admitting: Emergency Medicine

## 2023-05-21 ENCOUNTER — Other Ambulatory Visit: Payer: Self-pay

## 2023-05-21 DIAGNOSIS — Z8739 Personal history of other diseases of the musculoskeletal system and connective tissue: Secondary | ICD-10-CM | POA: Diagnosis not present

## 2023-05-21 DIAGNOSIS — M25531 Pain in right wrist: Secondary | ICD-10-CM | POA: Insufficient documentation

## 2023-05-21 DIAGNOSIS — Z79899 Other long term (current) drug therapy: Secondary | ICD-10-CM | POA: Diagnosis not present

## 2023-05-21 DIAGNOSIS — I10 Essential (primary) hypertension: Secondary | ICD-10-CM | POA: Diagnosis not present

## 2023-05-21 LAB — BASIC METABOLIC PANEL
Anion gap: 7 (ref 5–15)
BUN: 21 mg/dL (ref 8–23)
CO2: 28 mmol/L (ref 22–32)
Calcium: 9.8 mg/dL (ref 8.9–10.3)
Chloride: 101 mmol/L (ref 98–111)
Creatinine, Ser: 1.17 mg/dL (ref 0.61–1.24)
GFR, Estimated: >60 mL/min (ref >60)
Glucose, Bld: 102 mg/dL — ABNORMAL HIGH (ref 70–99)
Potassium: 3.9 mmol/L (ref 3.5–5.1)
Sodium: 136 mmol/L (ref 135–145)

## 2023-05-21 LAB — CBC WITH DIFFERENTIAL/PLATELET
Abs Immature Granulocytes: 0 10*3/uL (ref 0.00–0.07)
Basophils Absolute: 0 10*3/uL (ref 0.0–0.1)
Basophils Relative: 0 %
Eosinophils Absolute: 0.1 10*3/uL (ref 0.0–0.5)
Eosinophils Relative: 2 %
HCT: 43.3 % (ref 39.0–52.0)
Hemoglobin: 15 g/dL (ref 13.0–17.0)
Immature Granulocytes: 0 %
Lymphocytes Relative: 37 %
Lymphs Abs: 1.9 10*3/uL (ref 0.7–4.0)
MCH: 30.4 pg (ref 26.0–34.0)
MCHC: 34.6 g/dL (ref 30.0–36.0)
MCV: 87.7 fL (ref 80.0–100.0)
Monocytes Absolute: 0.7 10*3/uL (ref 0.1–1.0)
Monocytes Relative: 13 %
Neutro Abs: 2.5 10*3/uL (ref 1.7–7.7)
Neutrophils Relative %: 48 %
Platelets: 233 10*3/uL (ref 150–400)
RBC: 4.94 MIL/uL (ref 4.22–5.81)
RDW: 13.1 % (ref 11.5–15.5)
WBC: 5.2 10*3/uL (ref 4.0–10.5)
nRBC: 0 % (ref 0.0–0.2)

## 2023-05-21 LAB — C-REACTIVE PROTEIN: CRP: 1.2 mg/dL — ABNORMAL HIGH (ref <1.0)

## 2023-05-21 LAB — SEDIMENTATION RATE: Sed Rate: 23 mm/hr — ABNORMAL HIGH (ref 0–16)

## 2023-05-21 MED ORDER — KETOROLAC TROMETHAMINE 15 MG/ML IJ SOLN
15.0000 mg | Freq: Once | INTRAMUSCULAR | Status: AC
  Administered 2023-05-21: 15 mg via INTRAVENOUS
  Filled 2023-05-21: qty 1

## 2023-05-21 MED ORDER — NAPROXEN 500 MG PO TABS
500.0000 mg | ORAL_TABLET | Freq: Two times a day (BID) | ORAL | 0 refills | Status: AC
  Filled 2023-05-21: qty 10, 5d supply, fill #0

## 2023-05-21 MED ORDER — LIDOCAINE HCL (PF) 1 % IJ SOLN
5.0000 mL | Freq: Once | INTRAMUSCULAR | Status: AC
  Administered 2023-05-21: 5 mL
  Filled 2023-05-21: qty 5

## 2023-05-21 MED ORDER — PREDNISONE 10 MG PO TABS
40.0000 mg | ORAL_TABLET | Freq: Every day | ORAL | 0 refills | Status: AC
  Filled 2023-05-21: qty 20, 5d supply, fill #0

## 2023-05-21 NOTE — Discharge Instructions (Signed)
We will treat you for a gout flare with steroids and high dose NSAIDs. Watch for signs of worsening symptoms despite outpatient management, to include worsening swelling, fever/chills, worsening redness and swelling, worsening pain with range of motion of the wrist.

## 2023-05-21 NOTE — ED Provider Notes (Signed)
[place] EMERGENCY DEPARTMENT AT [facility] Provider Note   CSN: [id number] Arrival date & time: 05/21/23  8841     History  Chief Complaint  Patient presents with   Wrist Pain    Richard Bates is a 67 y.o. male.   Wrist Pain     68 year old male with medical history significant for hypertension and gout who presents to the emergency department with right wrist pain and swelling for the last 3 days.  The patient states that he only gets swelling and pain associated with his gout in his great toe.  He states he has never had a gout flare in his right wrist.  He denies any fevers or chills, no redness, he has intact range of motion of the wrist.  He denies any falls or trauma.  Home Medications Prior to Admission medications   Medication Sig Start Date End Date Taking? Authorizing Provider  predniSONE (DELTASONE) 10 MG tablet Take 4 tablets (40 mg total) by mouth daily for 5 days. 05/21/23 05/26/23 Yes Ernie Avena, MD  acetaminophen (TYLENOL) 500 MG tablet Take 500 mg by mouth every 6 (six) hours as needed for moderate pain or headache.    [provider]  atorvastatin (LIPITOR) 40 MG tablet Take 40 mg by mouth daily. 09/22/19   [provider]  metoprolol tartrate (LOPRESSOR) 50 MG tablet Take 25 mg by mouth See admin instructions. 8 am and 3 pm 09/22/19   [provider]  Multiple Vitamin (MULTIVITAMIN WITH MINERALS) TABS tablet Take 1 tablet by mouth daily.    [provider]  naproxen (NAPROSYN) 500 MG tablet Take 1 tablet (500 mg total) by mouth 2 (two) times daily for 5 days. 05/21/23 05/26/23  Ernie Avena, MD  neomycin-bacitracin-polymyxin (NEOSPORIN) [phone] ointment Apply topically in the morning and at bedtime. Patient not taking: No sig reported 11/11/20   Rhys Martini, PA-C  olmesartan (BENICAR) 40 MG tablet Take 40 mg by mouth daily. 09/22/19   [provider]  Omega 3 1000 MG CAPS Take 1,000 mg by mouth  daily.    [provider]      Allergies    Patient has no known allergies.    Review of Systems   Review of Systems  All other systems reviewed and are negative.   Physical Exam Updated Vital Signs BP (!) 144/94   Pulse 76   Temp 98.1 F (36.7 C) (Oral)   Resp 18   Ht 5\' 8"  (1.727 m)   Wt 90.7 kg   SpO2 97%   BMI 30.41 kg/m  Physical Exam Vitals and nursing note reviewed.  Constitutional:      General: He is not in acute distress. HENT:     Head: Normocephalic and atraumatic.  Eyes:     Conjunctiva/sclera: Conjunctivae normal.     Pupils: Pupils are equal, round, and reactive to light.  Cardiovascular:     Rate and Rhythm: Normal rate and regular rhythm.  Pulmonary:     Effort: Pulmonary effort is normal. No respiratory distress.  Abdominal:     General: There is no distension.     Tenderness: There is no guarding.  Musculoskeletal:        General: Swelling and tenderness present. No deformity or signs of injury.     Cervical back: Neck supple.     Comments: Right wrist with minimal swelling, tenderness to palpation, no significant erythema, 2+ radial pulses, intact motor function along the median, ulnar, radial  nerve distributions.  Skin:    Findings: No lesion or rash.  Neurological:     General: No focal deficit present.     Mental Status: He is alert. Mental status is at baseline.     ED Results / Procedures / Treatments   Labs (all labs ordered are listed, but only abnormal results are displayed) Labs Reviewed  BASIC METABOLIC PANEL - Abnormal; Notable for the following components:      Result Value   Glucose, Bld 102 (*)    All other components within normal limits  CBC WITH DIFFERENTIAL/PLATELET  SYNOVIAL CELL COUNT + DIFF, W/ CRYSTALS  SEDIMENTATION RATE  C-REACTIVE PROTEIN    EKG None  Radiology DG Wrist Complete Right  Result Date: 05/21/2023 CLINICAL DATA:  pain, swelling EXAM: RIGHT WRIST - COMPLETE 3+ VIEW COMPARISON:  None  Available. FINDINGS: No acute fracture or dislocation. No aggressive osseous lesion. There are mild degenerative changes of the imaged joints. No radiopaque foreign bodies. Soft tissues are within normal limits. IMPRESSION: *No acute osseous abnormality of the right wrist joint. Electronically Signed   By: Jules Schick M.D.   On: 05/21/2023 08:20    Procedures .Joint Aspiration/Arthrocentesis  Date/Time: 05/21/2023 10:15 AM  Performed by: Ernie Avena, MD Authorized by: Ernie Avena, MD   Consent:    Consent obtained:  Written   Consent given by:  Patient   Risks discussed:  Bleeding and infection Universal protocol:    Patient identity confirmed:  Arm band Location:    Location:  Wrist   Wrist:  R radiocarpal Anesthesia:    Anesthesia method:  Local infiltration   Local anesthetic:  Lidocaine 1% w/o epi Procedure details:    Preparation: Patient was prepped and draped in usual sterile fashion     Needle gauge:  22 G   Ultrasound guidance: no     Approach:  Medial   Aspirate amount:  0 Post-procedure details:    Procedure completion:  Tolerated Comments:     Dry tap, no fluid aspirated     Medications Ordered in ED Medications  lidocaine (PF) (XYLOCAINE) 1 % injection 5 mL (has no administration in time range)  ketorolac (TORADOL) 15 MG/ML injection 15 mg (has no administration in time range)    ED Course/ Medical Decision Making/ A&P                             Medical Decision Making Amount and/or Complexity of Data Reviewed Labs: ordered. Radiology: ordered.  Risk Prescription drug management.    67 year old male with medical history significant for hypertension and gout who presents to the emergency department with right wrist pain and swelling for the last 3 days.  The patient states that he only gets swelling and pain associated with his gout in his great toe.  He states he has never had a gout flare in his right wrist.  He denies any fevers or chills,  no redness, he has intact range of motion of the wrist.  He denies any falls or trauma.  On arrival, the patient was vitally stable.  Presenting with most likely gout flare of the right wrist, additional differential diagnosis includes pseudogout, septic arthritis although considered less likely.  X-ray imaging was performed revealed no evidence of fracture, dislocation or other osseous abnormality.  Mild joint effusion was palpated.  The patient was offered arthrocentesis versus watchful waiting and treatment for potential gout flare.  The patient  would prefer to go forward with an arthrocentesis.  Arthrocentesis was performed as per the procedure note above which resulted in a dry tap with no significant fluid aspirated.  Will treat conservatively for gout, advised the patient to closely watch his wrist over the next few days, return for any severe worsening symptoms to include worsening redness, swelling, fever, chills, pain with range of motion despite outpatient management.   Final Clinical Impression(s) / ED Diagnoses Final diagnoses:  Right wrist pain  Hx of gout    Rx / DC Orders ED Discharge Orders          Ordered    predniSONE (DELTASONE) 10 MG tablet  Daily        05/21/23 1021    naproxen (NAPROSYN) 500 MG tablet  2 times daily        05/21/23 1021              Ernie Avena, MD 05/21/23 1023

## 2023-05-21 NOTE — ED Triage Notes (Addendum)
Pt arrives pov c/o RT wrist pain and swelling x 3 days. Denies injury, endorses decreased ROM
